# Patient Record
Sex: Female | Born: 1955 | Race: White | Hispanic: No | Smoking: Never smoker
Health system: Southern US, Community
[De-identification: ages and names within clinical notes are randomized; demographics above are authoritative.]

## PROBLEM LIST (undated history)

## (undated) DIAGNOSIS — N200 Calculus of kidney: Secondary | ICD-10-CM

## (undated) DIAGNOSIS — I10 Essential (primary) hypertension: Secondary | ICD-10-CM

---

## 1998-10-14 ENCOUNTER — Other Ambulatory Visit: Admission: RE | Admit: 1998-10-14 | Discharge: 1998-10-14 | Payer: Self-pay | Admitting: Internal Medicine

## 2000-02-29 ENCOUNTER — Other Ambulatory Visit: Admission: RE | Admit: 2000-02-29 | Discharge: 2000-02-29 | Payer: Self-pay | Admitting: Internal Medicine

## 2000-03-07 ENCOUNTER — Encounter: Payer: Self-pay | Admitting: Internal Medicine

## 2000-03-07 ENCOUNTER — Encounter: Admission: RE | Admit: 2000-03-07 | Discharge: 2000-03-07 | Payer: Self-pay | Admitting: Internal Medicine

## 2008-01-19 HISTORY — PX: ABDOMINAL HYSTERECTOMY: SHX81

## 2010-06-13 ENCOUNTER — Emergency Department (HOSPITAL_COMMUNITY)

## 2010-06-13 ENCOUNTER — Emergency Department (HOSPITAL_COMMUNITY)
Admission: EM | Admit: 2010-06-13 | Discharge: 2010-06-13 | Disposition: A | Discharge status: Home / Self Care | Attending: Emergency Medicine | Admitting: Emergency Medicine

## 2010-06-13 DIAGNOSIS — S5010XA Contusion of unspecified forearm, initial encounter: Secondary | ICD-10-CM | POA: Insufficient documentation

## 2010-06-13 DIAGNOSIS — IMO0002 Reserved for concepts with insufficient information to code with codable children: Secondary | ICD-10-CM | POA: Insufficient documentation

## 2010-06-13 DIAGNOSIS — M79609 Pain in unspecified limb: Secondary | ICD-10-CM | POA: Insufficient documentation

## 2010-06-13 DIAGNOSIS — Z23 Encounter for immunization: Secondary | ICD-10-CM | POA: Insufficient documentation

## 2010-06-13 DIAGNOSIS — Y9302 Activity, running: Secondary | ICD-10-CM | POA: Insufficient documentation

## 2010-06-13 DIAGNOSIS — W1809XA Striking against other object with subsequent fall, initial encounter: Secondary | ICD-10-CM | POA: Insufficient documentation

## 2016-04-12 ENCOUNTER — Other Ambulatory Visit: Payer: Self-pay | Admitting: Family Medicine

## 2016-04-12 DIAGNOSIS — Z1231 Encounter for screening mammogram for malignant neoplasm of breast: Secondary | ICD-10-CM

## 2016-05-03 ENCOUNTER — Ambulatory Visit
Admission: RE | Admit: 2016-05-03 | Discharge: 2016-05-03 | Disposition: A | Discharge status: Home / Self Care | Source: Ambulatory Visit | Attending: Family Medicine | Admitting: Family Medicine

## 2016-05-03 DIAGNOSIS — Z1231 Encounter for screening mammogram for malignant neoplasm of breast: Secondary | ICD-10-CM

## 2017-03-29 ENCOUNTER — Other Ambulatory Visit: Payer: Self-pay | Admitting: Family Medicine

## 2017-03-29 DIAGNOSIS — Z1231 Encounter for screening mammogram for malignant neoplasm of breast: Secondary | ICD-10-CM

## 2017-04-12 ENCOUNTER — Other Ambulatory Visit: Payer: Self-pay | Admitting: Family Medicine

## 2017-04-12 DIAGNOSIS — R10824 Left lower quadrant rebound abdominal tenderness: Secondary | ICD-10-CM

## 2017-04-20 ENCOUNTER — Ambulatory Visit
Admission: RE | Admit: 2017-04-20 | Discharge: 2017-04-20 | Disposition: A | Discharge status: Home / Self Care | Source: Ambulatory Visit | Attending: Family Medicine | Admitting: Family Medicine

## 2017-04-20 DIAGNOSIS — R10824 Left lower quadrant rebound abdominal tenderness: Secondary | ICD-10-CM

## 2017-05-04 ENCOUNTER — Ambulatory Visit
Admission: RE | Admit: 2017-05-04 | Discharge: 2017-05-04 | Disposition: A | Discharge status: Home / Self Care | Source: Ambulatory Visit | Attending: Family Medicine | Admitting: Family Medicine

## 2017-05-04 DIAGNOSIS — Z1231 Encounter for screening mammogram for malignant neoplasm of breast: Secondary | ICD-10-CM

## 2017-06-25 DIAGNOSIS — M545 Low back pain: Secondary | ICD-10-CM | POA: Diagnosis not present

## 2017-06-25 DIAGNOSIS — Z79899 Other long term (current) drug therapy: Secondary | ICD-10-CM | POA: Insufficient documentation

## 2017-06-25 DIAGNOSIS — N281 Cyst of kidney, acquired: Secondary | ICD-10-CM | POA: Diagnosis not present

## 2017-06-25 DIAGNOSIS — K529 Noninfective gastroenteritis and colitis, unspecified: Secondary | ICD-10-CM | POA: Insufficient documentation

## 2017-06-25 DIAGNOSIS — I10 Essential (primary) hypertension: Secondary | ICD-10-CM | POA: Insufficient documentation

## 2017-06-25 DIAGNOSIS — R1084 Generalized abdominal pain: Secondary | ICD-10-CM | POA: Diagnosis present

## 2017-06-26 ENCOUNTER — Other Ambulatory Visit: Payer: Self-pay

## 2017-06-26 ENCOUNTER — Emergency Department (HOSPITAL_COMMUNITY)

## 2017-06-26 ENCOUNTER — Emergency Department (HOSPITAL_COMMUNITY)
Admission: EM | Admit: 2017-06-26 | Discharge: 2017-06-26 | Disposition: A | Discharge status: Home / Self Care | Attending: Emergency Medicine | Admitting: Emergency Medicine

## 2017-06-26 ENCOUNTER — Encounter (HOSPITAL_COMMUNITY): Payer: Self-pay | Admitting: Emergency Medicine

## 2017-06-26 DIAGNOSIS — K529 Noninfective gastroenteritis and colitis, unspecified: Secondary | ICD-10-CM

## 2017-06-26 HISTORY — DX: Essential (primary) hypertension: I10

## 2017-06-26 HISTORY — DX: Calculus of kidney: N20.0

## 2017-06-26 LAB — CBC WITH DIFFERENTIAL/PLATELET
Basophils Absolute: 0 10*3/uL (ref 0.0–0.1)
Basophils Relative: 0 %
Eosinophils Absolute: 0.1 10*3/uL (ref 0.0–0.7)
Eosinophils Relative: 1 %
HEMATOCRIT: 44.9 % (ref 36.0–46.0)
HEMOGLOBIN: 15 g/dL (ref 12.0–15.0)
LYMPHS ABS: 2.8 10*3/uL (ref 0.7–4.0)
LYMPHS PCT: 16 %
MCH: 29.4 pg (ref 26.0–34.0)
MCHC: 33.4 g/dL (ref 30.0–36.0)
MCV: 88 fL (ref 78.0–100.0)
Monocytes Absolute: 1 10*3/uL (ref 0.1–1.0)
Monocytes Relative: 6 %
NEUTROS PCT: 77 %
Neutro Abs: 13.7 10*3/uL — ABNORMAL HIGH (ref 1.7–7.7)
Platelets: 240 10*3/uL (ref 150–400)
RBC: 5.1 MIL/uL (ref 3.87–5.11)
RDW: 12.9 % (ref 11.5–15.5)
WBC: 17.6 10*3/uL — AB (ref 4.0–10.5)

## 2017-06-26 LAB — URINALYSIS, ROUTINE W REFLEX MICROSCOPIC
Bilirubin Urine: NEGATIVE
GLUCOSE, UA: NEGATIVE mg/dL
Hgb urine dipstick: NEGATIVE
Ketones, ur: NEGATIVE mg/dL
LEUKOCYTES UA: NEGATIVE
Nitrite: NEGATIVE
PROTEIN: NEGATIVE mg/dL
Specific Gravity, Urine: 1.026 (ref 1.005–1.030)
pH: 5 (ref 5.0–8.0)

## 2017-06-26 LAB — BASIC METABOLIC PANEL
Anion gap: 13 (ref 5–15)
BUN: 28 mg/dL — AB (ref 6–20)
CHLORIDE: 105 mmol/L (ref 101–111)
CO2: 25 mmol/L (ref 22–32)
Calcium: 9.5 mg/dL (ref 8.9–10.3)
Creatinine, Ser: 0.81 mg/dL (ref 0.44–1.00)
GFR calc Af Amer: >60 mL/min (ref >60)
GFR calc non Af Amer: >60 mL/min (ref >60)
GLUCOSE: 153 mg/dL — AB (ref 65–99)
Potassium: 3.1 mmol/L — ABNORMAL LOW (ref 3.5–5.1)
Sodium: 143 mmol/L (ref 135–145)

## 2017-06-26 LAB — LIPASE, BLOOD: LIPASE: 26 U/L (ref 11–51)

## 2017-06-26 MED ORDER — ONDANSETRON 4 MG PO TBDP: 4.0000 mg | ORAL_TABLET | Freq: Three times a day (TID) | ORAL | 0 refills | Status: AC | PRN

## 2017-06-26 MED ORDER — ONDANSETRON 8 MG PO TBDP
8.0000 mg | ORAL_TABLET | Freq: Once | ORAL | Status: AC
  Administered 2017-06-26: 8 mg via ORAL
  Filled 2017-06-26: qty 1

## 2017-06-26 MED ORDER — CIPROFLOXACIN HCL 500 MG PO TABS: 500.0000 mg | ORAL_TABLET | Freq: Two times a day (BID) | ORAL | 0 refills | Status: AC

## 2017-06-26 MED ORDER — FENTANYL CITRATE (PF) 100 MCG/2ML IJ SOLN
50.0000 ug | Freq: Once | INTRAMUSCULAR | Status: DC
  Filled 2017-06-26: qty 2

## 2017-06-26 MED ORDER — IOPAMIDOL (ISOVUE-300) INJECTION 61%
100.0000 mL | Freq: Once | INTRAVENOUS | Status: AC | PRN
  Administered 2017-06-26: 100 mL via INTRAVENOUS

## 2017-06-26 MED ORDER — METRONIDAZOLE 500 MG PO TABS: 500.0000 mg | ORAL_TABLET | Freq: Two times a day (BID) | ORAL | 0 refills | Status: DC

## 2017-06-26 NOTE — ED Triage Notes (Signed)
Pt arriving from home with abdominal pain and back pain that have been present for 4 months. Pt reports pain has gotten worse over the last couple days. CT scan in May showed small kidney stone and did not go to follow up appointment. Has appointment 07/05/17 with Urologist. Rochele Pagesook Ibuprofen at 8pm.

## 2017-06-26 NOTE — ED Provider Notes (Signed)
[facility]-EMERGENCY DEPT Provider Note   CSN: [id number] Arrival date & time: 06/25/17  2349     History   Chief Complaint Chief Complaint  Patient presents with  . Abdominal Pain  . Back Pain    HPI RAYDEN SCHEPER is a 62 y.o. female past medical history of hypertension, kidney stones who presents for evaluation of generalized abdominal pain, left-sided flank pain that is been intermittently ongoing for the last few months.  Patient reports that she had initially had some intermittent left-sided pain.  Went to her primary care doctor and had a outpatient CT scan that showed a small kidney stone.  Patient was scheduled to follow-up with urology in the beginning of May but states she was feeling better so she never went to the appointment.  Patient reports that over the last several weeks, she has started having intermittent pain.  She states she called the urologist office to arrange for another appointment but was not able to see them to the end of June.  Patient reports that over the last week, her pain has worsened in frequency, severity.  Patient reports that she feels like her generalized abdomen is sore with some intermittent focal pain noted to the lower abdomen and left-sided flank.  Patient reports that today, she felt nauseous and reports that after dinner, she had 2 episodes of nonbloody, nonbilious vomiting.  States her last bowel movement was earlier today.  She is still passing flatus.  No blood noted in her stools noted.  Patient denies any alleviating or aggravating factors.  She states that the pain will become more intense at certain kinds but denies any specific trigger.  She states it is not associated with any food or eating.  She has not taken anything for the pain.  Patient denies any fevers, chest pain, difficulty breathing, urinary symptoms, vaginal bleeding.  She has a history of abdominal hysterectomy.  No history of bowel obstructions.  No other  abdominal surgeries.  The history is provided by the patient.    Past Medical History:  Diagnosis Date  . Hypertension   . Kidney stones     There are no active problems to display for this patient.   Past Surgical History:  Procedure Laterality Date  . ABDOMINAL HYSTERECTOMY  2010     OB History   None      Home Medications    Prior to Admission medications   Medication Sig Start Date End Date Taking? Authorizing Provider  buPROPion (WELLBUTRIN) 75 MG tablet Take 75 mg by mouth daily.   Yes [provider]  citalopram (CELEXA) 40 MG tablet Take 40 mg by mouth daily.   Yes [provider]  diphenhydrAMINE (BENADRYL) 25 MG tablet Take 25 mg by mouth every 6 (six) hours as needed for allergies or sleep.   Yes [provider]  hydrochlorothiazide (HYDRODIURIL) 12.5 MG tablet Take 12.5 mg by mouth daily.   Yes [provider]  ibuprofen (ADVIL,MOTRIN) 200 MG tablet Take 200 mg by mouth every 6 (six) hours as needed for moderate pain.   Yes [provider]  Melatonin 3 MG TABS Take 3 mg by mouth at bedtime as needed (sleep).   Yes [provider]  Multiple Vitamin (MULTIVITAMIN WITH MINERALS) TABS tablet Take 1 tablet by mouth daily.   Yes [provider]  pantoprazole (PROTONIX) 40 MG tablet Take 40 mg by mouth daily.   Yes [provider]  ciprofloxacin (CIPRO) 500  MG tablet Take 1 tablet (500 mg total) by mouth every 12 (twelve) hours for 7 days. 06/26/17 07/03/17  Maxwell CaulLayden, Lindsey A, PA-C  metroNIDAZOLE (FLAGYL) 500 MG tablet Take 1 tablet (500 mg total) by mouth 2 (two) times daily. 06/26/17   Maxwell CaulLayden, Lindsey A, PA-C  ondansetron (ZOFRAN ODT) 4 MG disintegrating tablet Take 1 tablet (4 mg total) by mouth every 8 (eight) hours as needed for nausea or vomiting. 06/26/17   Maxwell CaulLayden, Lindsey A, PA-C    Family History No family history on file.  Social History Social History   Tobacco Use  . Smoking status:  Never Smoker  . Smokeless tobacco: Never Used  Substance Use Topics  . Alcohol use: Never    Frequency: Never  . Drug use: Never     Allergies   Patient has no known allergies.   Review of Systems Review of Systems  Constitutional: Negative for fever.  Respiratory: Negative for cough and shortness of breath.   Cardiovascular: Negative for chest pain.  Gastrointestinal: Positive for abdominal pain, nausea and vomiting. Negative for blood in stool, constipation and diarrhea.  Genitourinary: Positive for flank pain. Negative for dysuria, hematuria and vaginal bleeding.  Neurological: Negative for headaches.  All other systems reviewed and are negative.    Physical Exam Updated Vital Signs BP (!) 141/79 (BP Location: Right Arm)   Pulse 88   Temp 97.9 F (36.6 C) (Oral)   Resp 16   Ht 5\' 4"  (1.626 m)   Wt 99.8 kg (220 lb)   SpO2 99%   BMI 37.76 kg/m   Physical Exam  Constitutional: She is oriented to person, place, and time. She appears well-developed and well-nourished.  Sitting comfortably on examination table  HENT:  Head: Normocephalic and atraumatic.  Mouth/Throat: Oropharynx is clear and moist and mucous membranes are normal.  Eyes: Pupils are equal, round, and reactive to light. Conjunctivae, EOM and lids are normal.  Neck: Full passive range of motion without pain.  Cardiovascular: Normal rate, regular rhythm, normal heart sounds and normal pulses. Exam reveals no gallop and no friction rub.  No murmur heard. Pulmonary/Chest: Effort normal and breath sounds normal.  Lungs clear to auscultation bilaterally.  Symmetric chest rise.  No wheezing, rales, rhonchi.   Abdominal: Soft. Normal appearance. There is tenderness in the suprapubic area and left lower quadrant. There is no rigidity, no guarding, no CVA tenderness and no tenderness at McBurney's point.  Abdomen is soft, nondistended.  Tenderness noted to the suprapubic and left lower quadrant.  No CVA tenderness  bilaterally.  Musculoskeletal: Normal range of motion.  Neurological: She is alert and oriented to person, place, and time.  Skin: Skin is warm and dry. Capillary refill takes less than 2 seconds.  Psychiatric: She has a normal mood and affect. Her speech is normal.  Nursing note and vitals reviewed.    ED Treatments / Results  Labs (all labs ordered are listed, but only abnormal results are displayed) Labs Reviewed  CBC WITH DIFFERENTIAL/PLATELET - Abnormal; Notable for the following components:      Result Value   WBC 17.6 (*)    Neutro Abs 13.7 (*)    All other components within normal limits  BASIC METABOLIC PANEL - Abnormal; Notable for the following components:   Potassium 3.1 (*)    Glucose, Bld 153 (*)    BUN 28 (*)    All other components within normal limits  LIPASE, BLOOD  URINALYSIS, ROUTINE W REFLEX MICROSCOPIC  EKG None  Radiology Ct Abdomen Pelvis W Contrast  Result Date: 06/26/2017 CLINICAL DATA:  Acute on chronic generalized abdominal and back pain. EXAM: CT ABDOMEN AND PELVIS WITH CONTRAST TECHNIQUE: Multidetector CT imaging of the abdomen and pelvis was performed using the standard protocol following bolus administration of intravenous contrast. CONTRAST:  ISOVUE-300 IOPAMIDOL (ISOVUE-300) INJECTION 61% COMPARISON:  CT of the abdomen and pelvis from 04/20/2017 FINDINGS: Lower chest: Minimal bibasilar atelectasis or scarring is noted. The visualized portions of the mediastinum are unremarkable. Hepatobiliary: The liver is unremarkable in appearance. The gallbladder is unremarkable in appearance. The common bile duct remains normal in caliber. Pancreas: The pancreas is within normal limits. Spleen: The spleen is unremarkable in appearance. Adrenals/Urinary Tract: The adrenal glands are unremarkable in appearance. A small left renal cyst is noted. There is no evidence of hydronephrosis. No renal or ureteral stones are identified. No perinephric stranding is  seen. There is incomplete rotation of the right kidney. Stomach/Bowel: The stomach is unremarkable in appearance. The small bowel is within normal limits. The appendix is normal in caliber, without evidence of appendicitis. There is question of slight wall thickening along the sigmoid colon, which could reflect a mild infectious or inflammatory process. The remainder of the colon is unremarkable. Vascular/Lymphatic: The abdominal aorta is unremarkable in appearance. The inferior vena cava is grossly unremarkable. No retroperitoneal lymphadenopathy is seen. No pelvic sidewall lymphadenopathy is identified. Reproductive: The bladder is mildly distended and grossly unremarkable. Postoperative change is noted about the right side of the bladder. The patient is status post hysterectomy. No suspicious adnexal masses are seen. Other: No additional soft tissue abnormalities are seen. Musculoskeletal: No acute osseous abnormalities are identified. Multilevel vacuum phenomenon is noted along the lumbar spine. The visualized musculature is unremarkable in appearance. IMPRESSION: 1. Question of slight wall thickening along the sigmoid colon, which could reflect a mild infectious or inflammatory process. 2. Small left renal cyst noted. No evidence of hydronephrosis. No renal or ureteral stone seen. Electronically Signed   By: Roanna Raider M.D.   On: 06/26/2017 04:59    Procedures Procedures (including critical care time)  Medications Ordered in ED Medications  fentaNYL (SUBLIMAZE) injection 50 mcg (0 mcg Intravenous Hold 06/26/17 0340)  ondansetron (ZOFRAN-ODT) disintegrating tablet 8 mg (8 mg Oral Given 06/26/17 0022)  iopamidol (ISOVUE-300) 61 % injection 100 mL (100 mLs Intravenous Contrast Given 06/26/17 0417)     Initial Impression / Assessment and Plan / ED Course  I have reviewed the triage vital signs and the nursing notes.  Pertinent labs & imaging results that were available during my care of the patient  were reviewed by me and considered in my medical decision making (see chart for details).     62 year old female who presents for evaluation of lower abdominal and left-sided flank pain.  Initially been ongoing for several months.  Worsened today.  Associate with nausea and vomiting.  No fevers.  No blood in stool.  History of hysterectomy.  No history of obstructions. Patient is afebrile, non-toxic appearing, sitting comfortably on examination table. Vital signs reviewed and stable.  On exam, patient is mildly tender noted to the suprapubic and left lower quadrant.  No CVA tenderness bilaterally.  Consider acute infectious etiology versus viral GI process versus kidney stone versus GU etiology.  Plan to check basic labs, UA.  IVF analgesics provided in the department.  BMP shows slight hypokalemia at 3.1.  BUN is slightly elevated at 28.  Glucose is 153.  UA with no hemoglobin, infectious etiology.  CBC shows leukocytosis of 17.6.  No significant anemia noted.  We will plan to proceed with CT abdomen pelvis for further evaluation.  CT abdomen pelvis shows question of slight wall thickening along the sigmoid colon which could reflect a mild infectious or inflammatory process.  Small left renal cyst noted.  No evidence of hydronephrosis.  No other abnormality seen.  Discussed results with patient.  She reports improvement in pain since being here in the ED.  Patient refused pain medications offered to her.  She has not had any more vomiting here in the ED.  Repeat abdominal exam shows improvement in tenderness.  Given concerns of inflammatory process along the sigmoid colon, will plan to treat with Cipro and Flagyl.  Instructed patient to follow-up with her primary care doctor in the next week for further evaluation.  Additionally, will plan to give outpatient GI referral since patient has a long-standing history of GI issues and has never had it evaluated. Patient had ample opportunity for questions and  discussion. All patient's questions were answered with full understanding. Strict return precautions discussed. Patient expresses understanding and agreement to plan.   Final Clinical Impressions(s) / ED Diagnoses   Final diagnoses:  Colitis    ED Discharge Orders        Ordered    ciprofloxacin (CIPRO) 500 MG tablet  Every 12 hours     06/26/17 0515    metroNIDAZOLE (FLAGYL) 500 MG tablet  2 times daily     06/26/17 0515    ondansetron (ZOFRAN ODT) 4 MG disintegrating tablet  Every 8 hours PRN     06/26/17 0516       Maxwell Caul, PA-C 06/26/17 0531    Benjiman Core, MD 06/26/17 0730

## 2017-06-26 NOTE — Discharge Instructions (Signed)
Take antibiotics as directed. Please take all of your antibiotics until finished.  Take Zofran as needed for nausea and vomiting.  Follow-up with your primary care doctor in 1 week.  Have your blood levels repeated to make sure your white blood cell count is going down.  Follow-up with referred GI for further evaluation.  Return to the Emergency Department immediately if you experience any worsening abdominal pain, fever, persistent nausea and vomiting, inability keep any food down, pain with urination, blood in your urine or any other worsening or concerning symptoms.

## 2017-06-26 NOTE — ED Notes (Signed)
Pt aware urine sample is needed, states that she needs fluids first.

## 2018-03-22 ENCOUNTER — Other Ambulatory Visit: Payer: Self-pay | Admitting: Family Medicine

## 2018-03-22 DIAGNOSIS — Z1231 Encounter for screening mammogram for malignant neoplasm of breast: Secondary | ICD-10-CM

## 2018-04-15 IMAGING — CT CT ABD-PELV W/O CM
1 of 2 series · 14 of 32 positions shown, 19 images · non-contrast
Comparison: None.

CLINICAL DATA: 61-year-old with left lower quadrant pain for 6
weeks. Hysterectomy.

EXAM:
CT ABDOMEN AND PELVIS WITHOUT CONTRAST
TECHNIQUE: Multidetector CT imaging of the abdomen and pelvis was performed
following the standard protocol without IV contrast.

[Series 2: abd/pelvis w/(date) · axial · 0.80mm/px · z∈[-365,+55]mm · 14 of 94 slices shown, 19 images]
[im 5/94  soft-tissue]
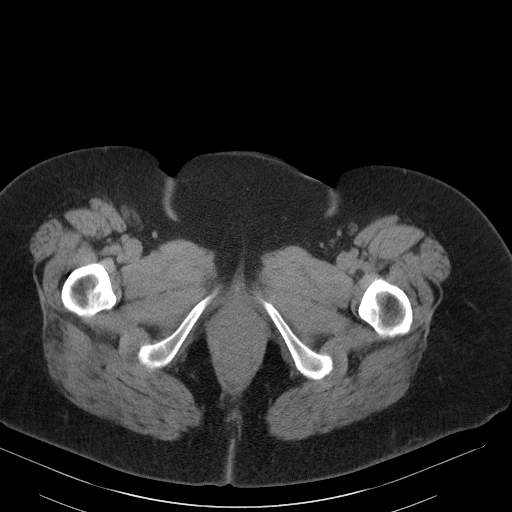
[im 5/94  bone]
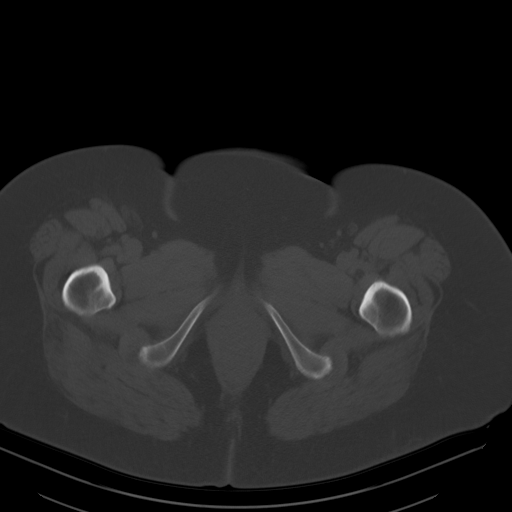
[im 15/94  soft-tissue]
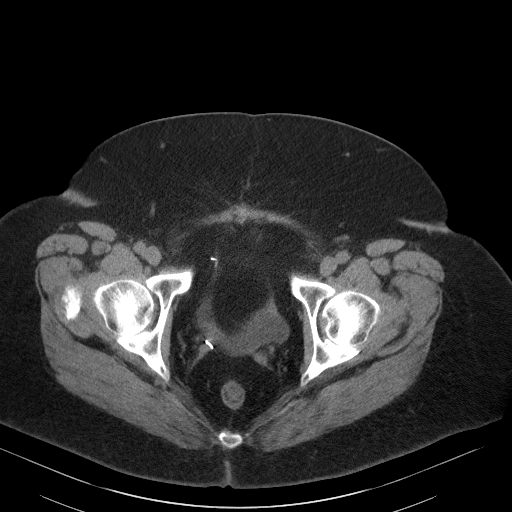
[im 20/94  soft-tissue]
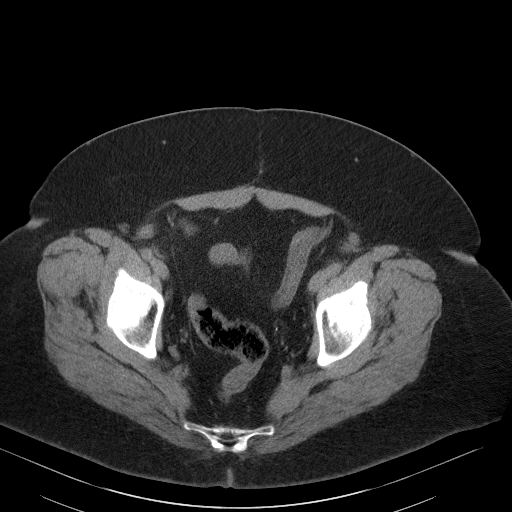
[im 25/94  soft-tissue]
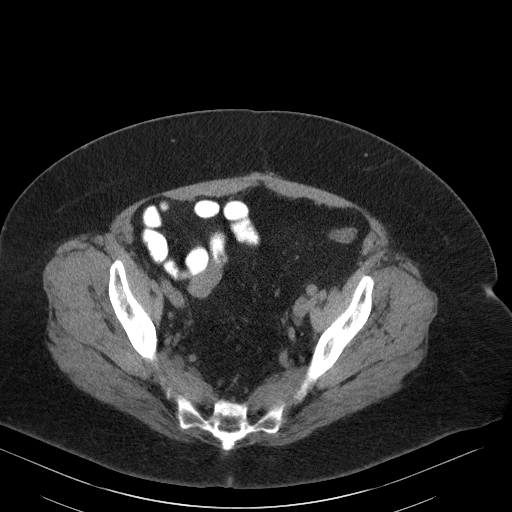
[im 35/94  soft-tissue]
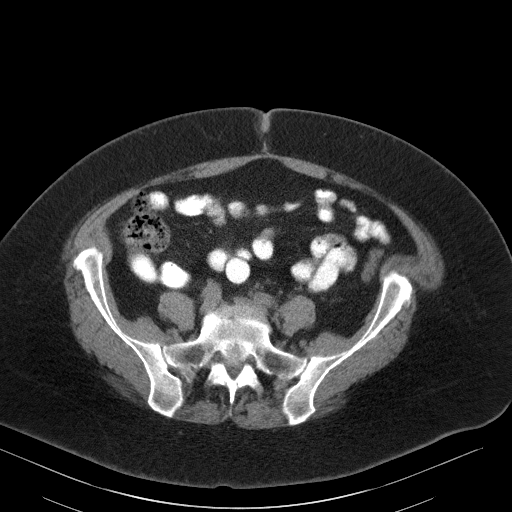
[im 40/94  soft-tissue]
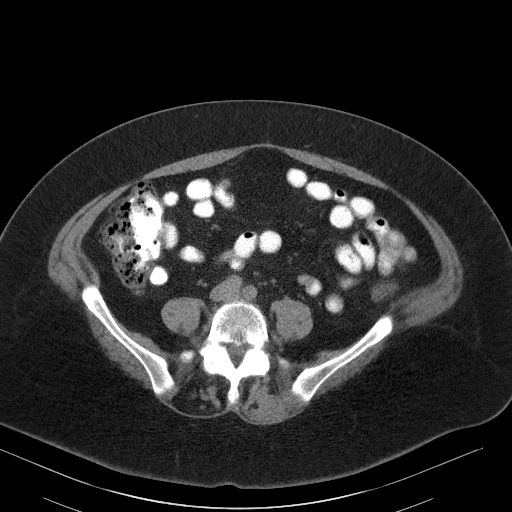
[im 49/94  soft-tissue]
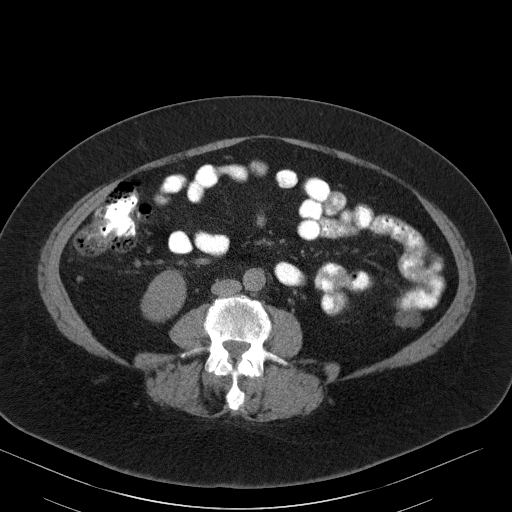
[im 54/94  soft-tissue]
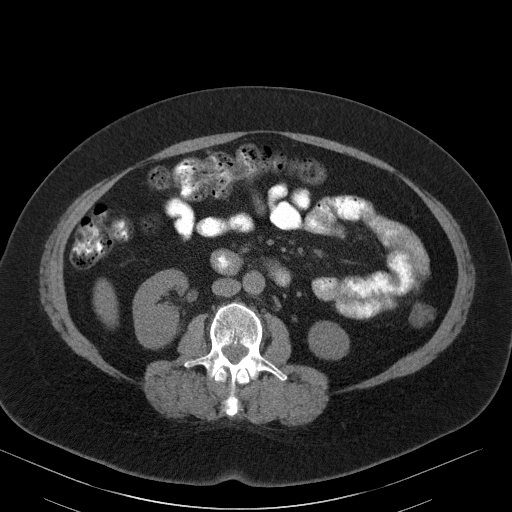
[im 59/94  soft-tissue]
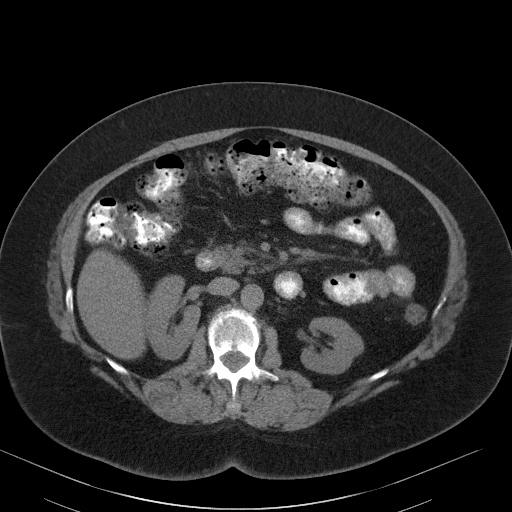
[im 59/94  bone]
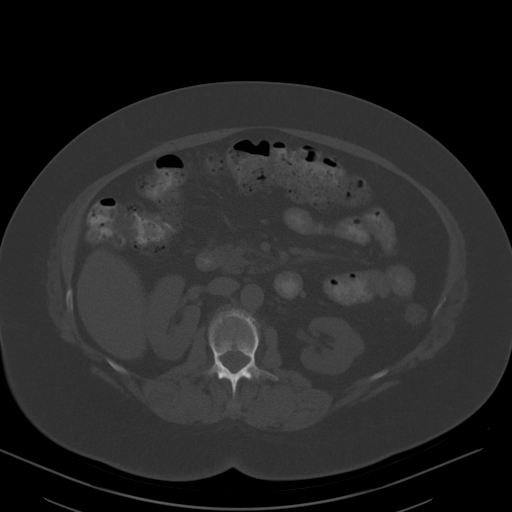
[im 69/94  soft-tissue]
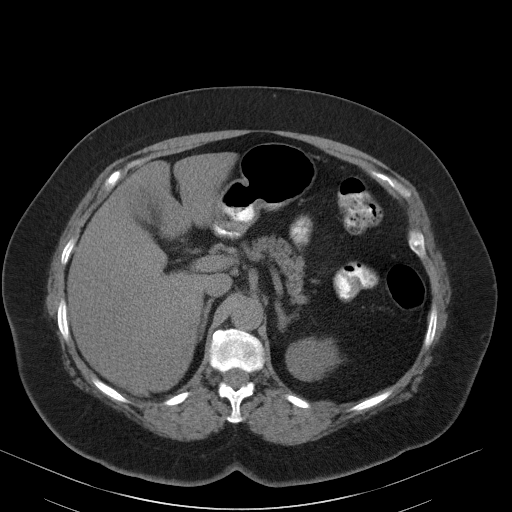
[im 74/94  soft-tissue]
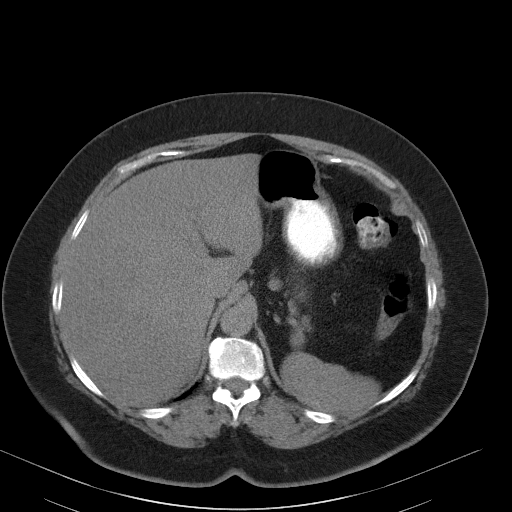
[im 74/94  lung]
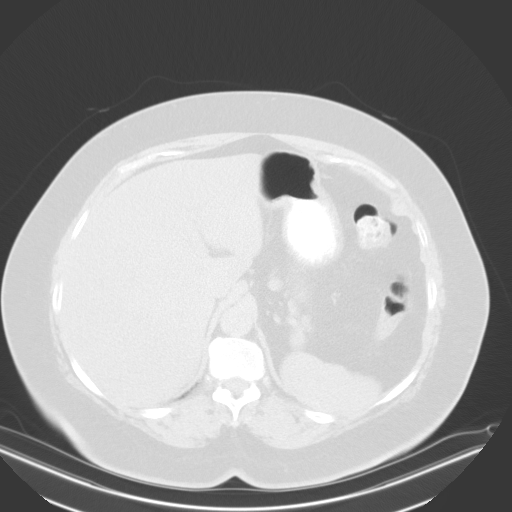
[im 79/94  soft-tissue]
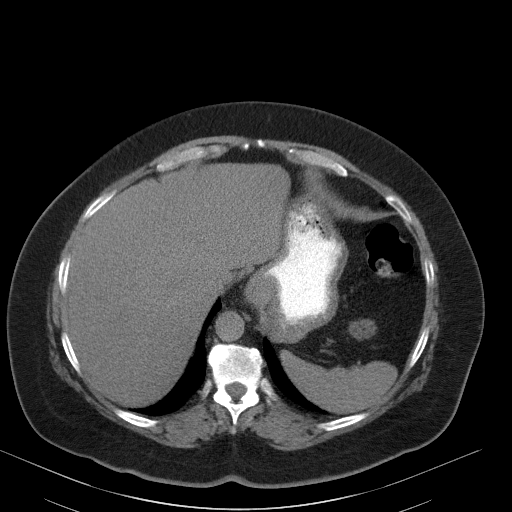
[im 79/94  lung]
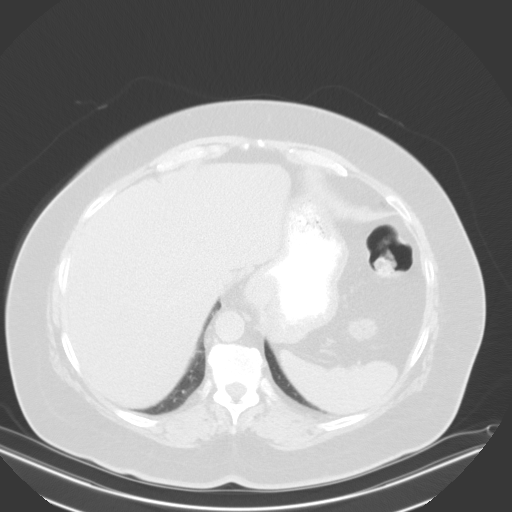
[im 84/94  lung]
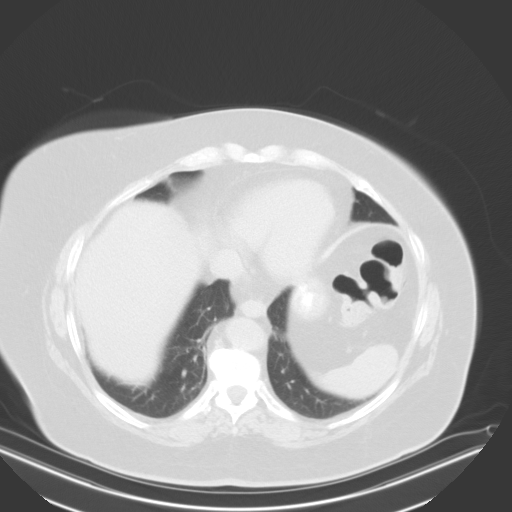
[im 89/94  soft-tissue]
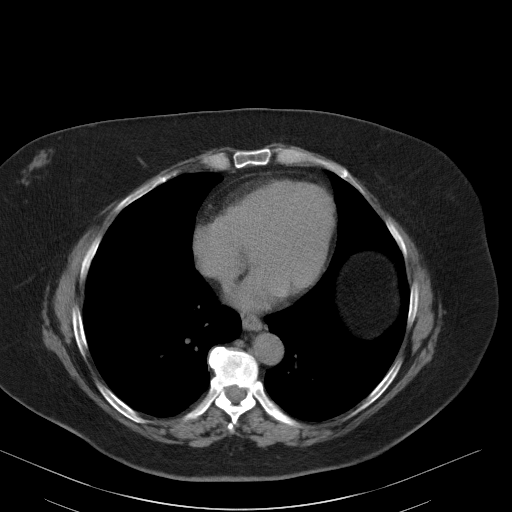
[im 89/94  lung]
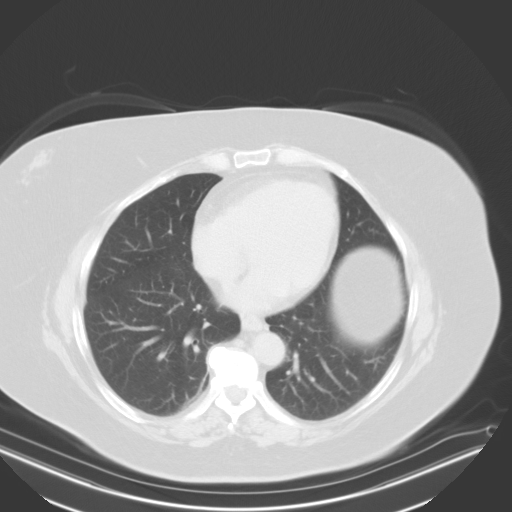

[14 of 32 positions shown; findings below may reference images not displayed]

FINDINGS: Lower chest: Peripheral densities along the right lung base are
suggestive for atelectasis or scarring. No pleural effusions.

Hepatobiliary: Normal appearance of the liver and gallbladder.

Pancreas: Normal appearance of the pancreas without inflammation or
duct dilatation.

Spleen: Normal in size without focal abnormality.

Adrenals/Urinary Tract: Normal adrenals. 2 mm stone in left kidney
upper pole without hydronephrosis. Urinary bladder is decompressed.
Normal appearance of the right kidney without stones or
hydronephrosis. No suspicious renal lesions.

Stomach/Bowel: Stomach is within normal limits. Appendix appears
normal. No evidence of bowel wall thickening, distention, or
inflammatory changes.

Vascular/Lymphatic: No significant vascular findings are present. No
enlarged abdominal or pelvic lymph nodes.

Reproductive: Status post hysterectomy. No adnexal masses.

Other: No free fluid.  No free air.

Musculoskeletal: No acute bone abnormality. Disc space narrowing
with vacuum disc phenomenon at L5-S1 and L2-L3.
IMPRESSION: Nonobstructive left kidney stone.

No acute bowel abnormality.

Degenerative disc disease in the lumbar spine.

## 2018-05-08 ENCOUNTER — Ambulatory Visit

## 2018-06-20 ENCOUNTER — Ambulatory Visit

## 2018-07-31 ENCOUNTER — Other Ambulatory Visit: Payer: Self-pay

## 2018-07-31 ENCOUNTER — Ambulatory Visit
Admission: RE | Admit: 2018-07-31 | Discharge: 2018-07-31 | Disposition: A | Discharge status: Home / Self Care | Source: Ambulatory Visit | Attending: Family Medicine | Admitting: Family Medicine

## 2018-07-31 DIAGNOSIS — Z1231 Encounter for screening mammogram for malignant neoplasm of breast: Secondary | ICD-10-CM

## 2019-06-19 ENCOUNTER — Other Ambulatory Visit: Payer: Self-pay | Admitting: Family Medicine

## 2019-06-19 DIAGNOSIS — Z1231 Encounter for screening mammogram for malignant neoplasm of breast: Secondary | ICD-10-CM

## 2019-08-01 ENCOUNTER — Ambulatory Visit
Admission: RE | Admit: 2019-08-01 | Discharge: 2019-08-01 | Disposition: A | Discharge status: Home / Self Care | Payer: Medicare Other | Source: Ambulatory Visit | Attending: Family Medicine | Admitting: Family Medicine

## 2019-08-01 ENCOUNTER — Other Ambulatory Visit: Payer: Self-pay

## 2019-08-01 DIAGNOSIS — Z1231 Encounter for screening mammogram for malignant neoplasm of breast: Secondary | ICD-10-CM

## 2019-08-06 ENCOUNTER — Other Ambulatory Visit: Payer: Self-pay | Admitting: Family Medicine

## 2019-08-06 DIAGNOSIS — R928 Other abnormal and inconclusive findings on diagnostic imaging of breast: Secondary | ICD-10-CM

## 2019-08-09 ENCOUNTER — Other Ambulatory Visit: Payer: TRICARE For Life (TFL)

## 2019-08-17 ENCOUNTER — Ambulatory Visit
Admission: RE | Admit: 2019-08-17 | Discharge: 2019-08-17 | Disposition: A | Discharge status: Home / Self Care | Payer: Medicare Other | Source: Ambulatory Visit | Attending: Family Medicine | Admitting: Family Medicine

## 2019-08-17 ENCOUNTER — Other Ambulatory Visit: Payer: Self-pay

## 2019-08-17 DIAGNOSIS — R928 Other abnormal and inconclusive findings on diagnostic imaging of breast: Secondary | ICD-10-CM

## 2020-04-23 ENCOUNTER — Emergency Department (HOSPITAL_COMMUNITY)
Admission: EM | Admit: 2020-04-23 | Discharge: 2020-04-23 | Disposition: A | Discharge status: Home / Self Care | Payer: Medicare Other | Attending: Emergency Medicine | Admitting: Emergency Medicine

## 2020-04-23 ENCOUNTER — Encounter (HOSPITAL_COMMUNITY): Payer: Self-pay

## 2020-04-23 ENCOUNTER — Other Ambulatory Visit: Payer: Self-pay

## 2020-04-23 DIAGNOSIS — S0081XA Abrasion of other part of head, initial encounter: Secondary | ICD-10-CM | POA: Insufficient documentation

## 2020-04-23 DIAGNOSIS — W19XXXA Unspecified fall, initial encounter: Secondary | ICD-10-CM

## 2020-04-23 DIAGNOSIS — Z23 Encounter for immunization: Secondary | ICD-10-CM | POA: Diagnosis not present

## 2020-04-23 DIAGNOSIS — W010XXA Fall on same level from slipping, tripping and stumbling without subsequent striking against object, initial encounter: Secondary | ICD-10-CM | POA: Insufficient documentation

## 2020-04-23 DIAGNOSIS — Y9301 Activity, walking, marching and hiking: Secondary | ICD-10-CM | POA: Insufficient documentation

## 2020-04-23 DIAGNOSIS — S0990XA Unspecified injury of head, initial encounter: Secondary | ICD-10-CM | POA: Diagnosis present

## 2020-04-23 DIAGNOSIS — I10 Essential (primary) hypertension: Secondary | ICD-10-CM | POA: Insufficient documentation

## 2020-04-23 DIAGNOSIS — R000 Tachycardia, unspecified: Secondary | ICD-10-CM | POA: Insufficient documentation

## 2020-04-23 DIAGNOSIS — Z79899 Other long term (current) drug therapy: Secondary | ICD-10-CM | POA: Diagnosis not present

## 2020-04-23 MED ORDER — TETANUS-DIPHTH-ACELL PERTUSSIS 5-2.5-18.5 LF-MCG/0.5 IM SUSY
0.5000 mL | PREFILLED_SYRINGE | Freq: Once | INTRAMUSCULAR | Status: AC
  Administered 2020-04-23: 0.5 mL via INTRAMUSCULAR
  Filled 2020-04-23: qty 0.5

## 2020-04-23 NOTE — ED Provider Notes (Signed)
[facility]-EMERGENCY DEPT Provider Note   CSN: [id number] Arrival date & time: 04/23/20  1959     History No chief complaint on file.   Sheri Ramos is a 65 y.o. female.  Patient was walking in the woods when she tripped over a root. She landed face first, striking another root. She has a superficial wound on the bridge of her nose from her glasses. Superficial abrasion to the left frontal area. Patient did not lose consciousness. She is not on blood thinners. Tetanus update due.  The history is provided by the patient. No language interpreter was used.  Fall This is a new problem. The current episode started 3 to 5 hours ago. Pertinent negatives include no headaches.       Past Medical History:  Diagnosis Date  . Hypertension   . Kidney stones     There are no problems to display for this patient.   Past Surgical History:  Procedure Laterality Date  . ABDOMINAL HYSTERECTOMY  2010     OB History   No obstetric history on file.     No family history on file.  Social History   Tobacco Use  . Smoking status: Never Smoker  . Smokeless tobacco: Never Used  Substance Use Topics  . Alcohol use: Never  . Drug use: Never    Home Medications Prior to Admission medications   Medication Sig Start Date End Date Taking? Authorizing Provider  buPROPion (WELLBUTRIN) 75 MG tablet Take 75 mg by mouth daily.    [provider]  citalopram (CELEXA) 40 MG tablet Take 40 mg by mouth daily.    [provider]  diphenhydrAMINE (BENADRYL) 25 MG tablet Take 25 mg by mouth every 6 (six) hours as needed for allergies or sleep.    [provider]  hydrochlorothiazide (HYDRODIURIL) 12.5 MG tablet Take 12.5 mg by mouth daily.    [provider]  ibuprofen (ADVIL,MOTRIN) 200 MG tablet Take 200 mg by mouth every 6 (six) hours as needed for moderate pain.    [provider]  Melatonin 3 MG TABS Take 3 mg by mouth at bedtime  as needed (sleep).    [provider]  metroNIDAZOLE (FLAGYL) 500 MG tablet Take 1 tablet (500 mg total) by mouth 2 (two) times daily. 06/26/17   Maxwell Caul, PA-C  Multiple Vitamin (MULTIVITAMIN WITH MINERALS) TABS tablet Take 1 tablet by mouth daily.    [provider]  ondansetron (ZOFRAN ODT) 4 MG disintegrating tablet Take 1 tablet (4 mg total) by mouth every 8 (eight) hours as needed for nausea or vomiting. 06/26/17   Maxwell Caul, PA-C  pantoprazole (PROTONIX) 40 MG tablet Take 40 mg by mouth daily.    [provider]    Allergies    Patient has no known allergies.  Review of Systems   Review of Systems  Neurological: Negative for headaches.    Physical Exam Updated Vital Signs BP (!) 152/102   Pulse (!) 117   Temp 98.7 F (37.1 C) (Oral)   Resp 18   Ht 5\' 4"  (1.626 m)   Wt 95.3 kg   SpO2 97%   BMI 36.05 kg/m   Physical Exam Vitals and nursing note reviewed.  Constitutional:      Appearance: Normal appearance.  HENT:     Head:      Mouth/Throat:     Mouth: Mucous membranes are moist.  Eyes:     Extraocular Movements: Extraocular  movements intact.     Conjunctiva/sclera: Conjunctivae normal.     Pupils: Pupils are equal, round, and reactive to light.  Cardiovascular:     Rate and Rhythm: Tachycardia present.     Pulses: Normal pulses.  Pulmonary:     Effort: Pulmonary effort is normal.     Breath sounds: Normal breath sounds.  Abdominal:     Palpations: Abdomen is soft.  Musculoskeletal:        General: No swelling, tenderness or deformity. Normal range of motion.     Cervical back: Normal range of motion and neck supple.  Skin:    General: Skin is warm and dry.  Neurological:     Mental Status: She is alert and oriented to person, place, and time.     Sensory: No sensory deficit.     Motor: No weakness.     Gait: Gait normal.  Psychiatric:        Mood and Affect: Mood normal.        Behavior: Behavior normal.      ED Results / Procedures / Treatments   Labs (all labs ordered are listed, but only abnormal results are displayed) Labs Reviewed - No data to display  EKG None  Radiology No results found.  Procedures Procedures   Medications Ordered in ED Medications  Tdap (BOOSTRIX) injection 0.5 mL (0.5 mLs Intramuscular Given 04/23/20 2109)    ED Course  I have reviewed the triage vital signs and the nursing notes.  Pertinent labs & imaging results that were available during my care of the patient were reviewed by me and considered in my medical decision making (see chart for details).    MDM Rules/Calculators/A&P                          Tetanus updated in ED. Patient with superficial facial abrasions. No current concern for significant head injury. Pt is hemodynamically stable with no complaints prior to dc.    Patient noted to be hypertensive in the emergency department.  No signs of hypertensive urgency.  Discussed with patient the need for close follow-up and management by their primary care physician.   Discussed reasons to return to the emergency department including any new  severe headaches, disequilibrium, vomiting, double vision, extremity weakness, difficulty ambulating, or any other concerning symptoms.   Final Clinical Impression(s) / ED Diagnoses Final diagnoses:  Fall, initial encounter  Abrasion of face, initial encounter    Rx / DC Orders ED Discharge Orders    None       Felicie Morn, NP 04/23/20 2129    Rolan Bucco, MD 04/23/20 2236

## 2020-04-23 NOTE — ED Triage Notes (Signed)
Patient arrived stating she was walking in the woods today and tripped and scraped the side of her forehead and nose. Ambulatory after fall. No LOC

## 2020-04-23 NOTE — Discharge Instructions (Signed)
Please refer to the attached instructions 

## 2020-07-04 ENCOUNTER — Other Ambulatory Visit: Payer: Self-pay | Admitting: Family Medicine

## 2020-07-04 DIAGNOSIS — Z1231 Encounter for screening mammogram for malignant neoplasm of breast: Secondary | ICD-10-CM

## 2020-08-28 ENCOUNTER — Ambulatory Visit
Admission: RE | Admit: 2020-08-28 | Discharge: 2020-08-28 | Disposition: A | Discharge status: Home / Self Care | Payer: Medicare Other | Source: Ambulatory Visit | Attending: Family Medicine | Admitting: Family Medicine

## 2020-08-28 ENCOUNTER — Other Ambulatory Visit: Payer: Self-pay

## 2020-08-28 DIAGNOSIS — Z1231 Encounter for screening mammogram for malignant neoplasm of breast: Secondary | ICD-10-CM

## 2020-10-31 ENCOUNTER — Emergency Department (HOSPITAL_BASED_OUTPATIENT_CLINIC_OR_DEPARTMENT_OTHER): Payer: Medicare Other

## 2020-10-31 ENCOUNTER — Encounter (HOSPITAL_BASED_OUTPATIENT_CLINIC_OR_DEPARTMENT_OTHER): Payer: Self-pay

## 2020-10-31 ENCOUNTER — Other Ambulatory Visit: Payer: Self-pay

## 2020-10-31 ENCOUNTER — Emergency Department (HOSPITAL_BASED_OUTPATIENT_CLINIC_OR_DEPARTMENT_OTHER)
Admission: EM | Admit: 2020-10-31 | Discharge: 2020-10-31 | Disposition: A | Discharge status: Home / Self Care | Payer: Medicare Other | Attending: Emergency Medicine | Admitting: Emergency Medicine

## 2020-10-31 DIAGNOSIS — J0110 Acute frontal sinusitis, unspecified: Secondary | ICD-10-CM | POA: Diagnosis not present

## 2020-10-31 DIAGNOSIS — E876 Hypokalemia: Secondary | ICD-10-CM | POA: Diagnosis not present

## 2020-10-31 DIAGNOSIS — I10 Essential (primary) hypertension: Secondary | ICD-10-CM | POA: Diagnosis not present

## 2020-10-31 DIAGNOSIS — Z79899 Other long term (current) drug therapy: Secondary | ICD-10-CM | POA: Diagnosis not present

## 2020-10-31 DIAGNOSIS — J40 Bronchitis, not specified as acute or chronic: Secondary | ICD-10-CM

## 2020-10-31 DIAGNOSIS — R059 Cough, unspecified: Secondary | ICD-10-CM | POA: Diagnosis present

## 2020-10-31 DIAGNOSIS — D72829 Elevated white blood cell count, unspecified: Secondary | ICD-10-CM | POA: Insufficient documentation

## 2020-10-31 DIAGNOSIS — J3489 Other specified disorders of nose and nasal sinuses: Secondary | ICD-10-CM | POA: Diagnosis not present

## 2020-10-31 DIAGNOSIS — J209 Acute bronchitis, unspecified: Secondary | ICD-10-CM | POA: Diagnosis not present

## 2020-10-31 LAB — CBC WITH DIFFERENTIAL/PLATELET
Abs Immature Granulocytes: 0.04 10*3/uL (ref 0.00–0.07)
Basophils Absolute: 0 10*3/uL (ref 0.0–0.1)
Basophils Relative: 0 %
Eosinophils Absolute: 0 10*3/uL (ref 0.0–0.5)
Eosinophils Relative: 0 %
HCT: 41.4 % (ref 36.0–46.0)
Hemoglobin: 13.6 g/dL (ref 12.0–15.0)
Immature Granulocytes: 0 %
Lymphocytes Relative: 7 %
Lymphs Abs: 1 10*3/uL (ref 0.7–4.0)
MCH: 28.2 pg (ref 26.0–34.0)
MCHC: 32.9 g/dL (ref 30.0–36.0)
MCV: 85.7 fL (ref 80.0–100.0)
Monocytes Absolute: 0.9 10*3/uL (ref 0.1–1.0)
Monocytes Relative: 7 %
Neutro Abs: 11.1 10*3/uL — ABNORMAL HIGH (ref 1.7–7.7)
Neutrophils Relative %: 86 %
Platelets: 178 10*3/uL (ref 150–400)
RBC: 4.83 MIL/uL (ref 3.87–5.11)
RDW: 13.5 % (ref 11.5–15.5)
WBC: 13 10*3/uL — ABNORMAL HIGH (ref 4.0–10.5)
nRBC: 0 % (ref 0.0–0.2)

## 2020-10-31 LAB — COMPREHENSIVE METABOLIC PANEL
ALT: 89 U/L — ABNORMAL HIGH (ref 0–44)
AST: 71 U/L — ABNORMAL HIGH (ref 15–41)
Albumin: 4.2 g/dL (ref 3.5–5.0)
Alkaline Phosphatase: 96 U/L (ref 38–126)
Anion gap: 11 (ref 5–15)
BUN: 11 mg/dL (ref 8–23)
CO2: 28 mmol/L (ref 22–32)
Calcium: 9.3 mg/dL (ref 8.9–10.3)
Chloride: 100 mmol/L (ref 98–111)
Creatinine, Ser: 0.58 mg/dL (ref 0.44–1.00)
GFR, Estimated: >60 mL/min (ref >60)
Glucose, Bld: 129 mg/dL — ABNORMAL HIGH (ref 70–99)
Potassium: 2.9 mmol/L — ABNORMAL LOW (ref 3.5–5.1)
Sodium: 139 mmol/L (ref 135–145)
Total Bilirubin: 0.6 mg/dL (ref 0.3–1.2)
Total Protein: 7.1 g/dL (ref 6.5–8.1)

## 2020-10-31 MED ORDER — LACTATED RINGERS IV BOLUS
1000.0000 mL | Freq: Once | INTRAVENOUS | Status: AC
  Administered 2020-10-31: 1000 mL via INTRAVENOUS

## 2020-10-31 MED ORDER — POTASSIUM CHLORIDE CRYS ER 20 MEQ PO TBCR
40.0000 meq | EXTENDED_RELEASE_TABLET | Freq: Once | ORAL | Status: AC
  Administered 2020-10-31: 40 meq via ORAL
  Filled 2020-10-31: qty 2

## 2020-10-31 MED ORDER — POTASSIUM CHLORIDE 10 MEQ/100ML IV SOLN: 10.0000 meq | Freq: Once | INTRAVENOUS | Status: DC

## 2020-10-31 MED ORDER — KETOROLAC TROMETHAMINE 15 MG/ML IJ SOLN
15.0000 mg | Freq: Once | INTRAMUSCULAR | Status: AC
  Administered 2020-10-31: 15 mg via INTRAVENOUS
  Filled 2020-10-31: qty 1

## 2020-10-31 MED ORDER — AMOXICILLIN-POT CLAVULANATE 875-125 MG PO TABS: 1.0000 | ORAL_TABLET | Freq: Two times a day (BID) | ORAL | 0 refills | Status: AC

## 2020-10-31 MED ORDER — AMOXICILLIN-POT CLAVULANATE 875-125 MG PO TABS
1.0000 | ORAL_TABLET | Freq: Once | ORAL | Status: AC
  Administered 2020-10-31: 1 via ORAL
  Filled 2020-10-31: qty 1

## 2020-10-31 NOTE — ED Provider Notes (Signed)
[facility] EMERGENCY DEPT Provider Note   CSN: [id number] Arrival date & time: 10/31/20  2017     History Chief Complaint  Patient presents with   Cough   Headache    Sheri Ramos is a 65 y.o. female.  Patient is a 65 year old female with a history of hypertension who is presenting today with several complaints.  Patient reports over the last few weeks she had noticed her ears felt itchy she would have occasional scratchy throat but basically on Tuesday she started to have significant congestion, sore throat and headache with facial pain.  She saw her doctor on Wednesday and that time tested negative for COVID and flu was diagnosed with a viral illness.  She was given guaifenesin, OTC meds which she has been using however he has started feeling worse.  She reports the mucus the next does help relieve some of the congestion and saline spray but she has had poor oral intake because she has no appetite and just cannot stomach drinking or eating.  She has not had any vomiting or diarrhea.  She denies any shortness of breath.  The history is provided by the patient.  Cough Associated symptoms: headaches   Headache Associated symptoms: cough       Past Medical History:  Diagnosis Date   Hypertension    Kidney stones     There are no problems to display for this patient.   Past Surgical History:  Procedure Laterality Date   ABDOMINAL HYSTERECTOMY  2010     OB History   No obstetric history on file.     No family history on file.  Social History   Tobacco Use   Smoking status: Never   Smokeless tobacco: Never  Substance Use Topics   Alcohol use: Never   Drug use: Never    Home Medications Prior to Admission medications   Medication Sig Start Date End Date Taking? Authorizing Provider  buPROPion (WELLBUTRIN) 75 MG tablet Take 75 mg by mouth daily.    [provider]  citalopram (CELEXA) 40 MG tablet Take 40 mg by mouth daily.    [provider]  diphenhydrAMINE (BENADRYL) 25 MG tablet Take 25 mg by mouth every 6 (six) hours as needed for allergies or sleep.    [provider]  hydrochlorothiazide (HYDRODIURIL) 12.5 MG tablet Take 12.5 mg by mouth daily.    [provider]  ibuprofen (ADVIL,MOTRIN) 200 MG tablet Take 200 mg by mouth every 6 (six) hours as needed for moderate pain.    [provider]  Melatonin 3 MG TABS Take 3 mg by mouth at bedtime as needed (sleep).    [provider]  metroNIDAZOLE (FLAGYL) 500 MG tablet Take 1 tablet (500 mg total) by mouth 2 (two) times daily. 06/26/17   Maxwell Caul, PA-C  Multiple Vitamin (MULTIVITAMIN WITH MINERALS) TABS tablet Take 1 tablet by mouth daily.    [provider]  ondansetron (ZOFRAN ODT) 4 MG disintegrating tablet Take 1 tablet (4 mg total) by mouth every 8 (eight) hours as needed for nausea or vomiting. 06/26/17   Maxwell Caul, PA-C  pantoprazole (PROTONIX) 40 MG tablet Take 40 mg by mouth daily.    [provider]    Allergies    Patient has no known allergies.  Review of Systems   Review of Systems  Respiratory:  Positive for cough.   Neurological:  Positive for headaches.  All other systems reviewed and are negative.  Physical Exam Updated Vital Signs BP (!) 157/85 (BP Location: Right Arm)   Pulse 89   Temp 100.3 F (37.9 C) (Oral)   Resp 20   Ht 5\' 4"  (1.626 m)   Wt 95.3 kg   SpO2 95%   BMI 36.06 kg/m   Physical Exam Vitals and nursing note reviewed.  Constitutional:      General: She is not in acute distress.    Appearance: She is well-developed.  HENT:     Head: Normocephalic and atraumatic.     Right Ear: Tympanic membrane normal.     Left Ear: Tympanic membrane normal.     Nose: Congestion and rhinorrhea present.     Right Turbinates: Enlarged.     Left Turbinates: Enlarged.     Right Sinus: Frontal sinus tenderness present.     Left Sinus: Frontal sinus tenderness present.      Mouth/Throat:     Mouth: Mucous membranes are dry.     Pharynx: No oropharyngeal exudate or posterior oropharyngeal erythema.  Eyes:     Pupils: Pupils are equal, round, and reactive to light.  Cardiovascular:     Rate and Rhythm: Normal rate and regular rhythm.     Pulses: Normal pulses.     Heart sounds: Normal heart sounds. No murmur heard.   No friction rub.  Pulmonary:     Effort: Pulmonary effort is normal.     Breath sounds: Normal breath sounds. No wheezing or rales.  Abdominal:     General: Bowel sounds are normal. There is no distension.     Palpations: Abdomen is soft.     Tenderness: There is no abdominal tenderness. There is no guarding or rebound.  Musculoskeletal:        General: No tenderness. Normal range of motion.     Comments: No edema  Lymphadenopathy:     Cervical: No cervical adenopathy.  Skin:    General: Skin is warm and dry.     Findings: No rash.  Neurological:     Mental Status: She is alert and oriented to person, place, and time. Mental status is at baseline.     Cranial Nerves: No cranial nerve deficit.  Psychiatric:        Mood and Affect: Mood normal.        Behavior: Behavior normal.    ED Results / Procedures / Treatments   Labs (all labs ordered are listed, but only abnormal results are displayed) Labs Reviewed  CBC WITH DIFFERENTIAL/PLATELET - Abnormal; Notable for the following components:      Result Value   WBC 13.0 (*)    Neutro Abs 11.1 (*)    All other components within normal limits  COMPREHENSIVE METABOLIC PANEL - Abnormal; Notable for the following components:   Potassium 2.9 (*)    Glucose, Bld 129 (*)    AST 71 (*)    ALT 89 (*)    All other components within normal limits    EKG None  Radiology DG Chest Port 1 View  Result Date: 10/31/2020 CLINICAL DATA:  Cough for several days, initial encounter EXAM: PORTABLE CHEST 1 VIEW COMPARISON:  04/26/2020 FINDINGS: Cardiac shadow is within normal limits. Lungs are  well aerated bilaterally. Mild increased bronchitic markings are noted bilaterally without focal confluent infiltrate. No acute bony abnormality is seen. IMPRESSION: Changes of bronchitis.  No focal pneumonia is seen. Electronically Signed   By: 06/26/2020 M.D.   On: 10/31/2020 22:22    Procedures  Procedures   Medications Ordered in ED Medications  lactated ringers bolus 1,000 mL (has no administration in time range)    ED Course  I have reviewed the triage vital signs and the nursing notes.  Pertinent labs & imaging results that were available during my care of the patient were reviewed by me and considered in my medical decision making (see chart for details).    MDM Rules/Calculators/A&P                           Pt with symptoms consistent with viral URI vs sinusitis vs bronchitis vs pna.  Well appearing here but febrile.  No signs of breathing difficulty  No signs of pharyngitis, otitis or abnormal abdominal findings.   CXR with bronchitic changes, CBC with leukocytosis of 13.  Cmp with hypokalemia of 2.9.  Pt given IVF and toradol for headache.  Will replace with potassium.  Given length of sx and now with fever will also cover with augmentin  MDM   Amount and/or Complexity of Data Reviewed Clinical lab tests: ordered and reviewed Tests in the radiology section of CPT: ordered and reviewed Independent visualization of images, tracings, or specimens: yes     Final Clinical Impression(s) / ED Diagnoses Final diagnoses:  Bronchitis  Subacute frontal sinusitis  Hypokalemia    Rx / DC Orders ED Discharge Orders          Ordered    amoxicillin-clavulanate (AUGMENTIN) 875-125 MG tablet  Every 12 hours        10/31/20 2326             Gwyneth Sprout, MD 10/31/20 2327

## 2020-10-31 NOTE — Discharge Instructions (Signed)
Make sure you are eating foods with potassium.  Stay hydrated.  If you start having shortness of breath, vomiting, inability to eat or drink, generalized weakness return to the emergency room.

## 2020-10-31 NOTE — ED Triage Notes (Signed)
Patient here POV from Home with Cough, Nausea, and Body Aches.  Patient states she had been feeling ill since Tuesday and it began with a Sore Throat that has since progressed into a Productive Cough, Moderate Nausea, and Generalized Body Aches.  NAD Noted during Triage. BIB Wheelchair. A&Ox4. GCS 15.

## 2021-08-23 IMAGING — MG MM DIGITAL SCREENING BILAT W/ TOMO AND CAD
8 series · 8 of 24 positions shown · non-contrast
Comparison: Previous exam(s).

CLINICAL DATA: Screening.

EXAM:
DIGITAL SCREENING BILATERAL MAMMOGRAM WITH TOMOSYNTHESIS AND CAD
TECHNIQUE: Bilateral screening digital craniocaudal and mediolateral oblique
mammograms were obtained. Bilateral screening digital breast
tomosynthesis was performed. The images were evaluated with
computer-aided detection.

[R MLO synth-2D]
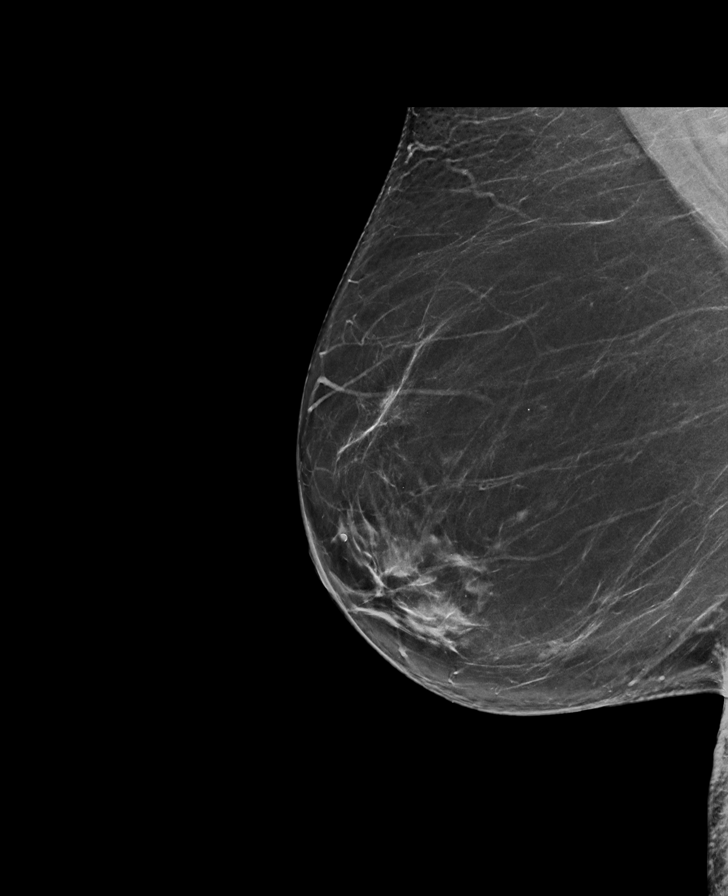

[L MLO synth-2D]
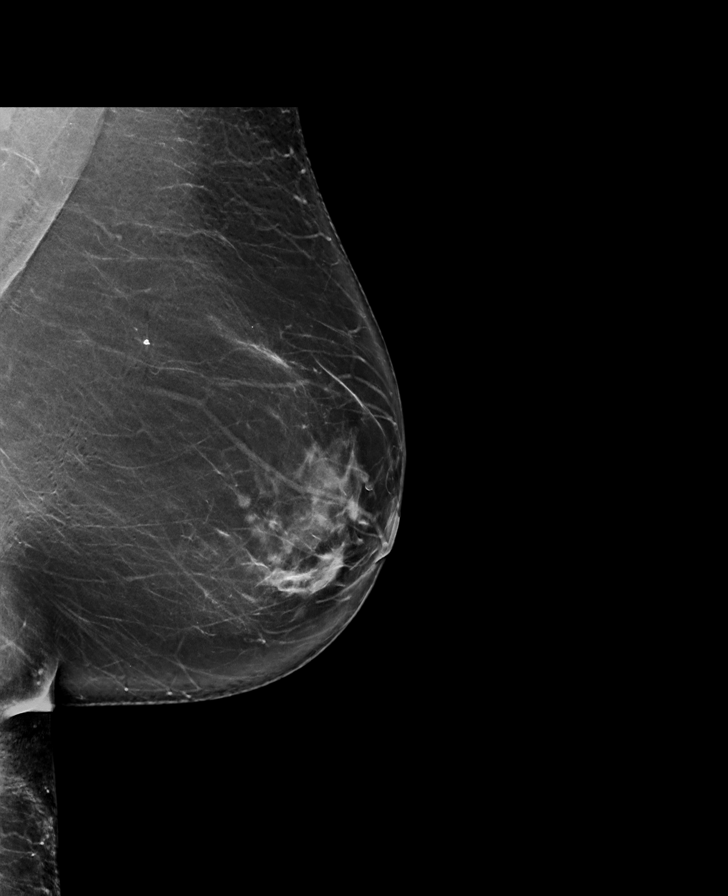

[L CC synth-2D]
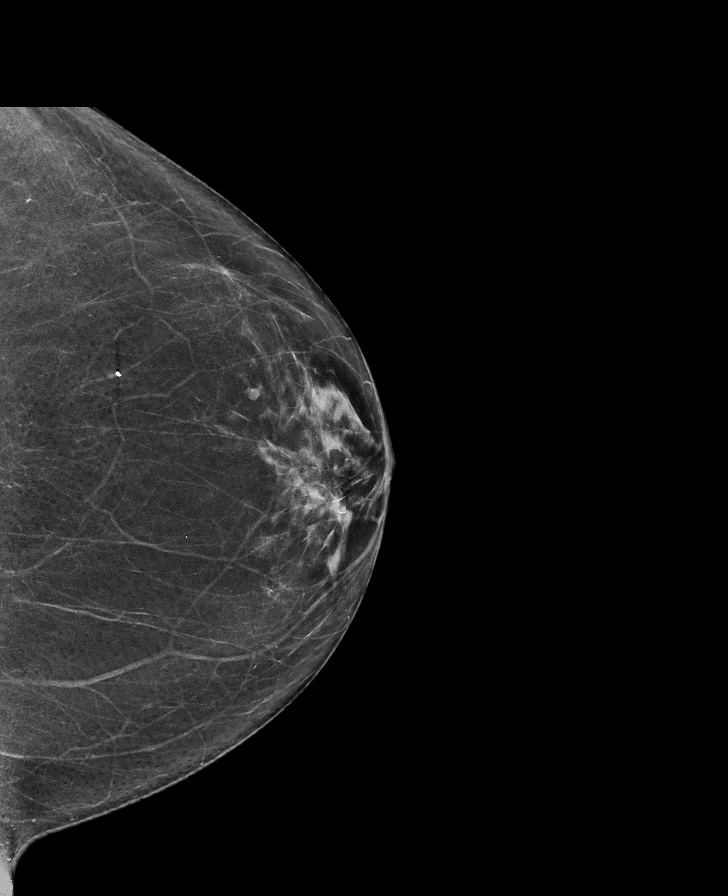

[R CC synth-2D]
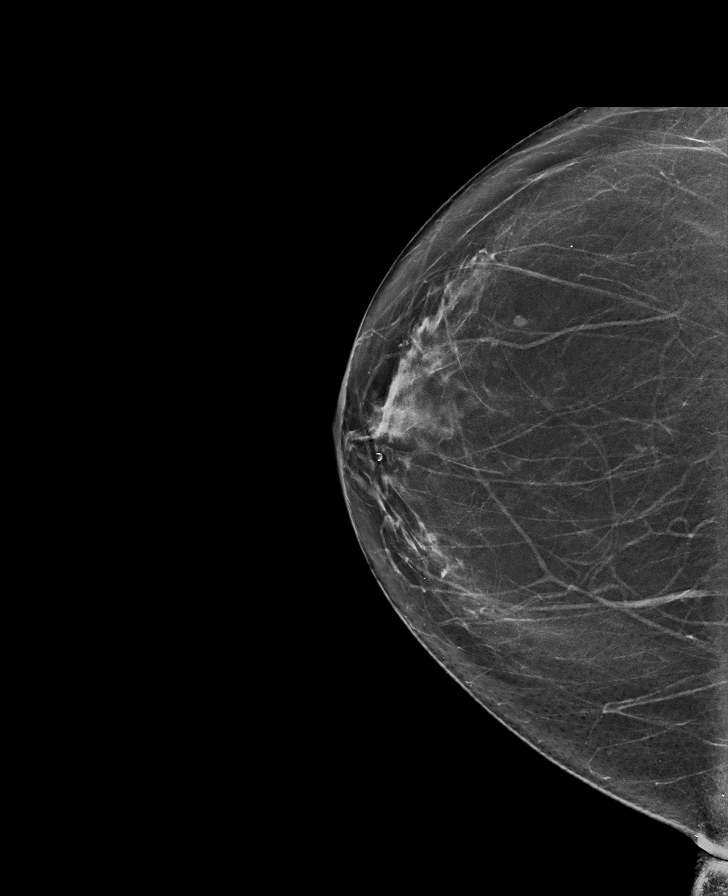

[L CC tomo · tomo slice 37/72.0]
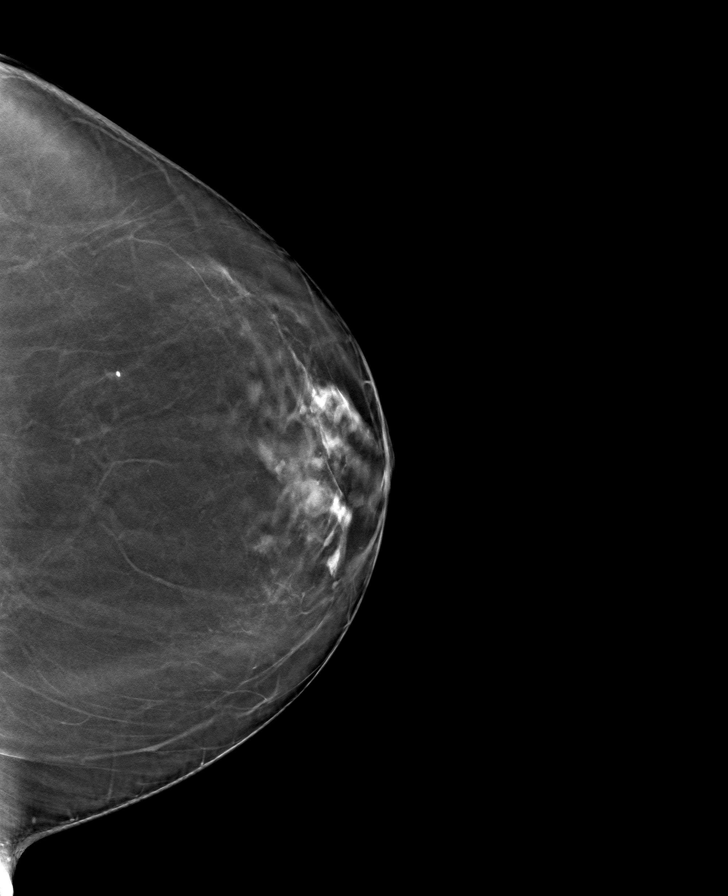

[R CC tomo · tomo slice 37/74.0]
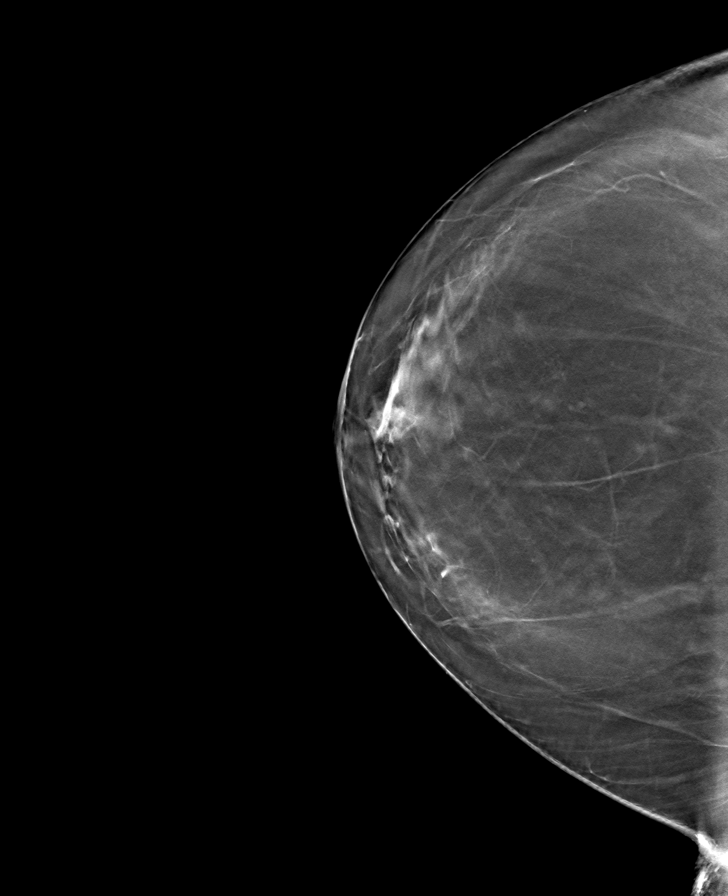

[R MLO tomo · tomo slice 41/81.0]
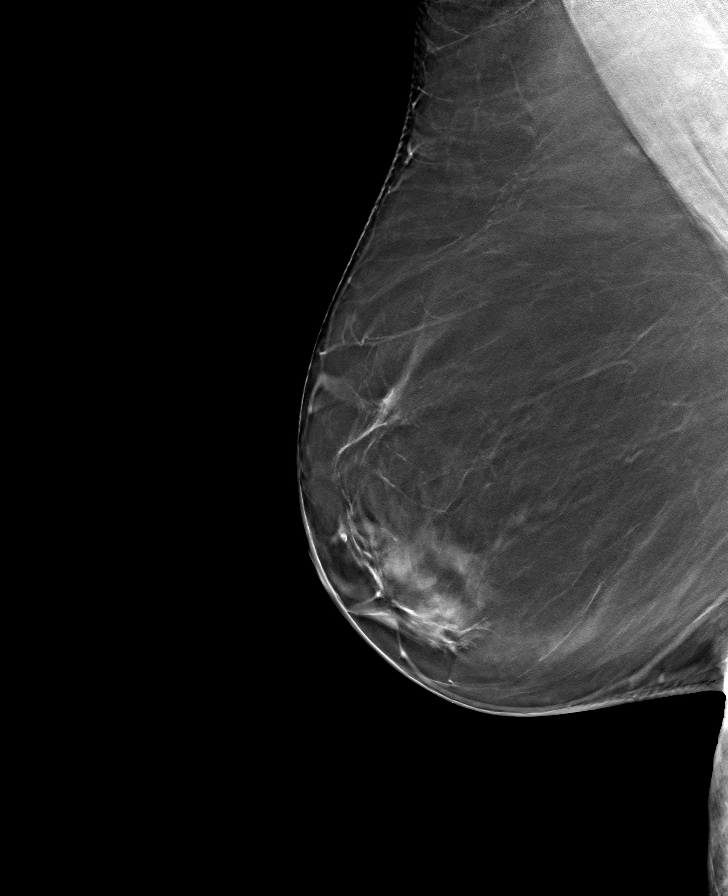

[L MLO tomo · tomo slice 43/86.0]
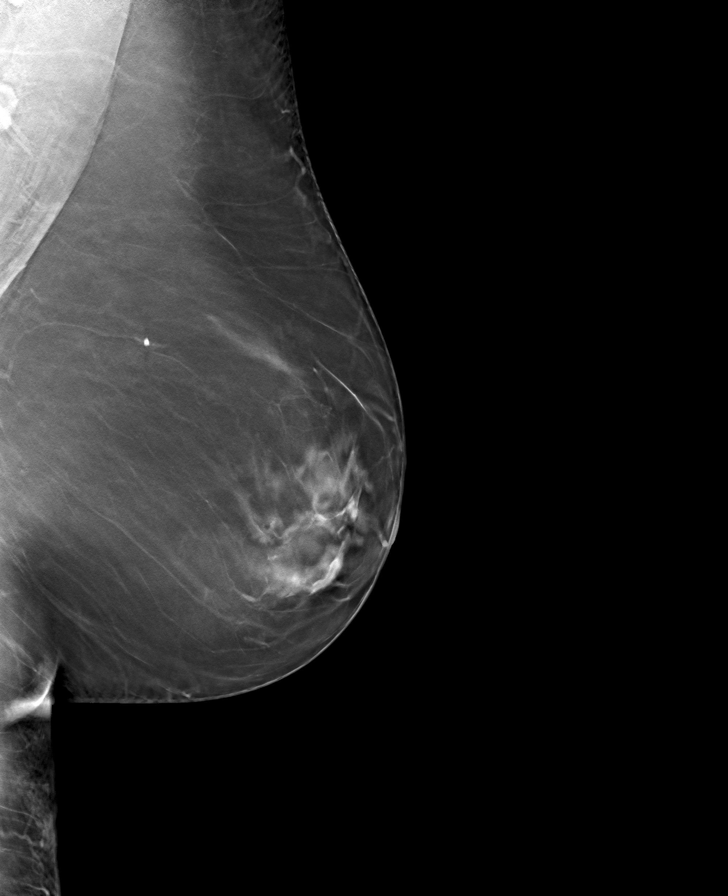

[8 of 24 positions shown; findings below may reference images not displayed]

ACR Breast Density Category b: There are scattered areas of
fibroglandular density.
FINDINGS: There are no findings suspicious for malignancy.
IMPRESSION: No mammographic evidence of malignancy. A result letter of this
screening mammogram will be mailed directly to the patient.

RECOMMENDATION:
Screening mammogram in one year. (Code:51-O-LD2)

BI-RADS CATEGORY  1: Negative.
